# Patient Record
Sex: Male | Born: 1965 | Race: White | Hispanic: No | Marital: Single | Smoking: Never smoker
Health system: Southern US, Community
[De-identification: ages and names within clinical notes are randomized; demographics above are authoritative.]

## PROBLEM LIST (undated history)

## (undated) DIAGNOSIS — R079 Chest pain, unspecified: Secondary | ICD-10-CM

## (undated) HISTORY — PX: TONSILLECTOMY: SUR1361

## (undated) HISTORY — DX: Chest pain, unspecified: R07.9

---

## 2011-10-06 ENCOUNTER — Encounter (HOSPITAL_COMMUNITY): Payer: Self-pay | Admitting: Emergency Medicine

## 2011-10-06 ENCOUNTER — Emergency Department (HOSPITAL_COMMUNITY)
Admission: EM | Admit: 2011-10-06 | Discharge: 2011-10-06 | Disposition: A | Discharge status: Home / Self Care | Payer: Self-pay | Attending: Emergency Medicine | Admitting: Emergency Medicine

## 2011-10-06 ENCOUNTER — Emergency Department (HOSPITAL_COMMUNITY): Payer: Self-pay

## 2011-10-06 DIAGNOSIS — M79609 Pain in unspecified limb: Secondary | ICD-10-CM | POA: Insufficient documentation

## 2011-10-06 DIAGNOSIS — W230XXA Caught, crushed, jammed, or pinched between moving objects, initial encounter: Secondary | ICD-10-CM | POA: Insufficient documentation

## 2011-10-06 DIAGNOSIS — T148XXA Other injury of unspecified body region, initial encounter: Secondary | ICD-10-CM

## 2011-10-06 DIAGNOSIS — S6990XA Unspecified injury of unspecified wrist, hand and finger(s), initial encounter: Secondary | ICD-10-CM | POA: Insufficient documentation

## 2011-10-06 DIAGNOSIS — IMO0002 Reserved for concepts with insufficient information to code with codable children: Secondary | ICD-10-CM | POA: Insufficient documentation

## 2011-10-06 DIAGNOSIS — S60229A Contusion of unspecified hand, initial encounter: Secondary | ICD-10-CM | POA: Insufficient documentation

## 2011-10-06 MED ORDER — IBUPROFEN 800 MG PO TABS
800.0000 mg | ORAL_TABLET | Freq: Once | ORAL | Status: AC
  Administered 2011-10-06: 800 mg via ORAL
  Filled 2011-10-06: qty 1

## 2011-10-06 MED ORDER — IBUPROFEN 800 MG PO TABS: 800.0000 mg | ORAL_TABLET | Freq: Three times a day (TID) | ORAL | Status: AC
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
## 2011-10-06 NOTE — ED Notes (Signed)
Pt alert, nad, c/o left hand pain, onset this evening, states "shut in house door", PMS intact, no obvious deformity noted, +edema noted,

## 2011-10-06 NOTE — ED Provider Notes (Signed)
History     CSN: [id number]  Arrival date & time 10/06/11  0403   First MD Initiated Contact with Patient 10/06/11 [phone]      Chief Complaint  Patient presents with  . Hand Injury    Left    (Consider location/radiation/quality/duration/timing/severity/associated sxs/prior treatment) Patient is a 46 y.o. male presenting with hand injury. The history is provided by the patient.  Hand Injury  The incident occurred 1 to 2 hours ago. The incident occurred at home. Injury mechanism: Left hand was shut in a door. The pain is present in the left hand. The quality of the pain is described as aching. The pain is moderate. The pain has been constant since the incident. Pertinent negatives include no fever. Associated symptoms comments: No associated weakness or numbness. No difficulty making a fist or opening his hand and fingers.Marland Kitchen He reports no foreign bodies present. The symptoms are aggravated by movement and palpation. He has tried nothing for the symptoms.   At home was going to shut the light off and rhythm and his wife shut the door. He sustained abrasion and injury to his left hand mostly over the dorsal aspect of second and third  Carpal bones. No bleeding. No other injury. Pain sharp in quality and not radiating. Constant since onset. History reviewed. No pertinent past medical history.  Past Surgical History  Procedure Date  . Tonsillectomy     No family history on file.  History  Substance Use Topics  . Smoking status: Never Smoker   . Smokeless tobacco: Not on file  . Alcohol Use: No      Review of Systems  Constitutional: Negative for fever and chills.  HENT: Negative for neck pain and neck stiffness.   Eyes: Negative for pain.  Respiratory: Negative for shortness of breath.   Cardiovascular: Negative for chest pain.  Gastrointestinal: Negative for abdominal pain.  Genitourinary: Negative for dysuria.  Musculoskeletal: Negative for back pain.  Skin: Positive for  wound.  Neurological: Negative for headaches.  All other systems reviewed and are negative.    Allergies  Review of patient's allergies indicates no known allergies.  Home Medications  No current outpatient prescriptions on file.  BP 142/91  Pulse 93  Temp(Src) 98.1 F (36.7 C) (Oral)  Resp 20  Wt 180 lb (81.647 kg)  SpO2 95%  Physical Exam  Constitutional: He is oriented to person, place, and time. He appears well-developed and well-nourished.  HENT:  Head: Normocephalic and atraumatic.  Eyes: EOM are normal. Pupils are equal, round, and reactive to light.  Neck: Trachea normal. Neck supple.  Cardiovascular: Normal rate, regular rhythm, S1 normal, S2 normal and normal pulses.     No systolic murmur is present   No diastolic murmur is present  Pulses:      Radial pulses are 2+ on the right side, and 2+ on the left side.  Pulmonary/Chest: Effort normal and breath sounds normal. He has no wheezes. He has no rhonchi. He has no rales. He exhibits no tenderness.  Abdominal: Normal appearance. There is no CVA tenderness and negative Murphy's sign.  Musculoskeletal:       Left upper extremity: Tenderness to palpation with associated swelling over second and third metacarpals. No tenderness over anatomical snuff box. No ecchymosis. There is mild abrasion but no deep laceration. Distal neurovascular intact with full flexion and extension to all digits. Distal cap refill intact  Neurological: He is alert and oriented to person, place, and time. He has  normal strength. No cranial nerve deficit or sensory deficit. GCS eye subscore is 4. GCS verbal subscore is 5. GCS motor subscore is 6.  Skin: Skin is warm and dry. No rash noted. He is not diaphoretic.  Psychiatric: His speech is normal.       Cooperative and appropriate    ED Course  Procedures (including critical care time)  Labs Reviewed - No data to display Dg Hand Complete Left  10/06/2011  *RADIOLOGY REPORT*  Clinical Data:  Left hand pain status post trauma.  LEFT HAND - COMPLETE 3+ VIEW  Comparison: None.  Findings: No displaced fracture or dislocation identified.  No aggressive osseous lesions.  IMPRESSION: No acute osseous abnormality identified.  Original Report Authenticated By: Waneta Martins, M.D.   Gustavus Bryant. Ibuprofen. X-ray as above.   MDM   Left hand injury with no fractures identified an x-ray. Distal neurovascular is intact and stable for discharge home with plan treatment ice, elevation, rest and NSAIDs        Sunnie Nielsen, MD 10/06/11 [phone]

## 2015-04-12 DIAGNOSIS — Y9355 Activity, bike riding: Secondary | ICD-10-CM | POA: Insufficient documentation

## 2015-04-12 DIAGNOSIS — S199XXA Unspecified injury of neck, initial encounter: Secondary | ICD-10-CM | POA: Diagnosis present

## 2015-04-12 DIAGNOSIS — S161XXA Strain of muscle, fascia and tendon at neck level, initial encounter: Secondary | ICD-10-CM | POA: Insufficient documentation

## 2015-04-12 DIAGNOSIS — S299XXA Unspecified injury of thorax, initial encounter: Secondary | ICD-10-CM | POA: Insufficient documentation

## 2015-04-12 DIAGNOSIS — Z880 Allergy status to penicillin: Secondary | ICD-10-CM | POA: Insufficient documentation

## 2015-04-12 DIAGNOSIS — Y9289 Other specified places as the place of occurrence of the external cause: Secondary | ICD-10-CM | POA: Diagnosis not present

## 2015-04-12 DIAGNOSIS — Y998 Other external cause status: Secondary | ICD-10-CM | POA: Diagnosis not present

## 2015-04-13 ENCOUNTER — Emergency Department (HOSPITAL_COMMUNITY): Payer: No Typology Code available for payment source

## 2015-04-13 ENCOUNTER — Emergency Department (HOSPITAL_COMMUNITY)
Admission: EM | Admit: 2015-04-13 | Discharge: 2015-04-13 | Disposition: A | Discharge status: Home / Self Care | Payer: No Typology Code available for payment source | Attending: Emergency Medicine | Admitting: Emergency Medicine

## 2015-04-13 ENCOUNTER — Encounter (HOSPITAL_COMMUNITY): Payer: Self-pay | Admitting: Emergency Medicine

## 2015-04-13 DIAGNOSIS — S161XXA Strain of muscle, fascia and tendon at neck level, initial encounter: Secondary | ICD-10-CM

## 2015-04-13 MED ORDER — MELOXICAM 15 MG PO TABS: 15.0000 mg | ORAL_TABLET | Freq: Every day | ORAL | Status: DC

## 2015-04-13 MED ORDER — CYCLOBENZAPRINE HCL 10 MG PO TABS: 10.0000 mg | ORAL_TABLET | Freq: Two times a day (BID) | ORAL | Status: DC | PRN

## 2015-04-13 MED ORDER — NAPROXEN 500 MG PO TABS
500.0000 mg | ORAL_TABLET | Freq: Once | ORAL | Status: AC
  Administered 2015-04-13: 500 mg via ORAL
  Filled 2015-04-13: qty 1
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
## 2015-04-13 NOTE — Discharge Instructions (Signed)
Take mobic as needed for pain. Take flexeril as needed for muscle spasm. Refer to attached documents for more information. Return to the ED with worsening or concerning symptoms.  °

## 2018-12-23 ENCOUNTER — Ambulatory Visit
Admission: EM | Admit: 2018-12-23 | Discharge: 2018-12-23 | Disposition: A | Discharge status: Home / Self Care | Payer: Self-pay | Attending: Family Medicine | Admitting: Family Medicine

## 2018-12-23 ENCOUNTER — Encounter: Payer: Self-pay | Admitting: Emergency Medicine

## 2018-12-23 ENCOUNTER — Other Ambulatory Visit: Payer: Self-pay

## 2018-12-23 ENCOUNTER — Ambulatory Visit (INDEPENDENT_AMBULATORY_CARE_PROVIDER_SITE_OTHER): Payer: Self-pay

## 2018-12-23 DIAGNOSIS — R05 Cough: Secondary | ICD-10-CM

## 2018-12-23 DIAGNOSIS — R059 Cough, unspecified: Secondary | ICD-10-CM

## 2018-12-23 NOTE — ED Provider Notes (Signed)
[facility]    CSN: [id number] Arrival date & time: 12/23/18  1232     History   Chief Complaint Chief Complaint  Patient presents with  . Cough  . Dizziness    HPI Ernest Salazar is a 53 y.o. male.   Pt is a 52 year old male with no significant PMH.  Presents today with body aches, fatigue.  He has been sick with cough and viral type illness for the past month.  The sickness initially started off with a fever that lasted approximately 1 week and pretty consistent cough.  This has since resolved.  He does have a lingering cough.  His main complaints today are that he went to work and felt very fatigued climbing up the ladder and dizzy.  He is a Education administrator. Reports that he has been staying hydrated drinking plenty of fluids and eating healthy diet. He has been off work x 1 month.  He has had persistent headache and been taking Aleve twice a day for this which somewhat helps.  Unknown of any known sick contacts or COVID exposure.  No recent traveling.  No chest pain, shortness of breath, palpitations, lower extremity swelling.  No history of DVT or PE. He does not smoke.   ROS per HPI      History reviewed. No pertinent past medical history.  There are no active problems to display for this patient.   Past Surgical History:  Procedure Laterality Date  . TONSILLECTOMY         Home Medications    Prior to Admission medications   Medication Sig Start Date End Date Taking? Authorizing Provider  cyclobenzaprine (FLEXERIL) 10 MG tablet Take 1 tablet (10 mg total) by mouth 2 (two) times daily as needed for muscle spasms. 04/13/15   Szekalski, Kaitlyn, PA-C  meloxicam (MOBIC) 15 MG tablet Take 1 tablet (15 mg total) by mouth daily. 04/13/15   Emilia Beck, PA-C    Family History History reviewed. No pertinent family history.  Social History Social History   Tobacco Use  . Smoking status: Never Smoker  . Smokeless tobacco: Never Used  Substance Use Topics   . Alcohol use: No  . Drug use: Not on file     Allergies   Penicillins   Review of Systems Review of Systems   Physical Exam Triage Vital Signs ED Triage Vitals [12/23/18 1243]  Enc Vitals Group     BP (!) 132/93     Pulse Rate 85     Resp 20     Temp 97.9 F (36.6 C)     Temp Source Oral     SpO2 95 %     Weight      Height      Head Circumference      Peak Flow      Pain Score 8     Pain Loc      Pain Edu?      Excl. in GC?    No data found.  Updated Vital Signs BP (!) 132/93 (BP Location: Left Arm)   Pulse 85   Temp 97.9 F (36.6 C) (Oral)   Resp 20   SpO2 95%   Visual Acuity Right Eye Distance:   Left Eye Distance:   Bilateral Distance:    Right Eye Near:   Left Eye Near:    Bilateral Near:     Physical Exam Vitals signs and nursing note reviewed.  Constitutional:      General:  He is not in acute distress.    Appearance: Normal appearance. He is not ill-appearing, toxic-appearing or diaphoretic.  HENT:     Head: Normocephalic and atraumatic.     Right Ear: Tympanic membrane and ear canal normal.     Left Ear: Tympanic membrane and ear canal normal.     Nose: Nose normal.     Mouth/Throat:     Pharynx: Oropharynx is clear.  Eyes:     Conjunctiva/sclera: Conjunctivae normal.  Neck:     Musculoskeletal: Normal range of motion.  Cardiovascular:     Rate and Rhythm: Normal rate and regular rhythm.  Pulmonary:     Effort: Pulmonary effort is normal.     Breath sounds: Normal breath sounds.  Musculoskeletal: Normal range of motion.  Skin:    General: Skin is warm and dry.  Neurological:     Mental Status: He is alert.  Psychiatric:        Mood and Affect: Mood normal.      UC Treatments / Results  Labs (all labs ordered are listed, but only abnormal results are displayed) Labs Reviewed - No data to display  EKG None  Radiology Dg Chest 2 View  Result Date: 12/23/2018 CLINICAL DATA:  Flu like symptoms for 6 weeks.  Cough.  EXAM: CHEST - 2 VIEW COMPARISON:  None. FINDINGS: The heart size and mediastinal contours are within normal limits. Both lungs are clear. The visualized skeletal structures are unremarkable. IMPRESSION: No active cardiopulmonary disease. Electronically Signed   By: Gerome Sam III M.D   On: 12/23/2018 13:36    Procedures Procedures (including critical care time)  Medications Ordered in UC Medications - No data to display  Initial Impression / Assessment and Plan / UC Course  I have reviewed the triage vital signs and the nursing notes.  Pertinent labs & imaging results that were available during my care of the patient were reviewed by me and considered in my medical decision making (see chart for details).     Cough-   Pt is a healthy 53 year old male the presents today with persistent lingering body aches, fatigue Most likely these are lingering effects of some sort of viral illness, most likely COVID exposure. Chest x-ray was normal Vital signs are stable and he is nontoxic or ill-appearing. We will have him take the rest of the week off and rest Instructed to stay hydrated and eat foods rich in potassium for the muscle cramping Follow up as needed for continued or worsening symptoms  Final Clinical Impressions(s) / UC Diagnoses   Final diagnoses:  Cough     Discharge Instructions     Your chest x-ray was normal This is most likely lingering effects from a viral illness most likely from COVID I will give you a note for the rest of the week off work to rest Make sure you are staying hydrated and drinking plenty of water Try to intake sources of potassium such as  ? Nuts, such as peanuts and pistachios. ? Seeds, such as sunflower seeds and pumpkin seeds. ? Peas, lentils, and lima beans. ? Whole grain and bran cereals and breads. ? Fresh fruits and vegetables, such as apricots, avocado, bananas, cantaloupe, kiwi, oranges, tomatoes, asparagus, and potatoes. ? Orange  juice. ? Tomato juice. ? Red meats. ? Yogurt. Follow up as needed for continued or worsening symptoms     ED Prescriptions    None     Controlled Substance Prescriptions  Controlled Substance Registry consulted?  Not Applicable   Janace ArisBast, Yazleemar Strassner A, NP 12/23/18 1430

## 2018-12-23 NOTE — ED Notes (Signed)
Patient able to ambulate independently  

## 2018-12-23 NOTE — ED Triage Notes (Signed)
Pt presents to [facility] for assessment after being out with flu-like symptoms for six weeks.  Pt states he has been feeling better, cough had resolved, so he decided to go back to work.  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

## 2018-12-23 NOTE — Discharge Instructions (Addendum)
Your chest x-ray was normal This is most likely lingering effects from a viral illness most likely from COVID I will give you a note for the rest of the week off work to rest Make sure you are staying hydrated and drinking plenty of water Try to intake sources of potassium such as  Nuts, such as peanuts and pistachios. Seeds, such as sunflower seeds and pumpkin seeds. Peas, lentils, and lima beans. Whole grain and bran cereals and breads. Fresh fruits and vegetables, such as apricots, avocado, bananas, cantaloupe, kiwi, oranges, tomatoes, asparagus, and potatoes. Orange juice. Tomato juice. Red meats. Yogurt. Follow up as needed for continued or worsening symptoms

## 2019-01-13 ENCOUNTER — Ambulatory Visit (INDEPENDENT_AMBULATORY_CARE_PROVIDER_SITE_OTHER): Payer: Self-pay

## 2019-01-13 ENCOUNTER — Ambulatory Visit
Admission: EM | Admit: 2019-01-13 | Discharge: 2019-01-13 | Disposition: A | Discharge status: Home / Self Care | Payer: Self-pay | Attending: Physician Assistant | Admitting: Physician Assistant

## 2019-01-13 ENCOUNTER — Telehealth: Payer: Self-pay

## 2019-01-13 DIAGNOSIS — R059 Cough, unspecified: Secondary | ICD-10-CM

## 2019-01-13 DIAGNOSIS — R05 Cough: Secondary | ICD-10-CM

## 2019-01-13 MED ORDER — ALBUTEROL SULFATE HFA 108 (90 BASE) MCG/ACT IN AERS: 1.0000 | INHALATION_SPRAY | Freq: Four times a day (QID) | RESPIRATORY_TRACT | 0 refills | Status: DC | PRN

## 2019-01-13 MED ORDER — ALBUTEROL SULFATE HFA 108 (90 BASE) MCG/ACT IN AERS: 1.0000 | INHALATION_SPRAY | Freq: Four times a day (QID) | RESPIRATORY_TRACT | 0 refills | Status: AC | PRN

## 2019-01-13 NOTE — ED Triage Notes (Signed)
Pt c/o dry cough, fatigue, SOB on exertion, neck pain, and headache for a month and a half

## 2019-01-13 NOTE — Discharge Instructions (Signed)
Schedule appointment for primary care evaluation.   Your chest xray if normal

## 2019-01-14 NOTE — ED Provider Notes (Signed)
[facility]    CSN: [id number] Arrival date & time: 01/13/19  1503     History   Chief Complaint Chief Complaint  Patient presents with  . Cough    HPI Sherre Lainommy Napier is a 53 y.o. male.   The history is provided by the patient. No language interpreter was used.  Cough  Cough characteristics:  Non-productive Sputum characteristics:  Nondescript Severity:  Moderate Onset quality:  Gradual Duration:  5 weeks Timing:  Constant Progression:  Unchanged Chronicity:  New Smoker: no   Relieved by:  Nothing Worsened by:  Nothing Ineffective treatments:  None tried Associated symptoms: myalgias   Associated symptoms: no chest pain   Pt thinks he may have had covid 19.  Pt was seen 3 weeks ago for a cough.  He had a normal chest xray.  Pt reports he still feels fatigued and still has a cough   History reviewed. No pertinent past medical history.  There are no active problems to display for this patient.   Past Surgical History:  Procedure Laterality Date  . TONSILLECTOMY         Home Medications    Prior to Admission medications   Medication Sig Start Date End Date Taking? Authorizing Provider  albuterol (VENTOLIN HFA) 108 (90 Base) MCG/ACT inhaler Inhale 1-2 puffs into the lungs every 6 (six) hours as needed for wheezing or shortness of breath. 01/13/19   Elson AreasSofia, Latresa Gasser K, PA-C    Family History No family history on file.  Social History Social History   Tobacco Use  . Smoking status: Never Smoker  . Smokeless tobacco: Never Used  Substance Use Topics  . Alcohol use: No  . Drug use: Not on file     Allergies   Penicillins   Review of Systems Review of Systems  Respiratory: Positive for cough.   Cardiovascular: Negative for chest pain.  Musculoskeletal: Positive for myalgias.  All other systems reviewed and are negative.    Physical Exam Triage Vital Signs ED Triage Vitals  Enc Vitals Group     BP 01/13/19 1548 132/85     Pulse  Rate 01/13/19 1548 77     Resp 01/13/19 1548 18     Temp 01/13/19 1548 98 F (36.7 C)     Temp Source 01/13/19 1548 Oral     SpO2 01/13/19 1548 97 %     Weight --      Height --      Head Circumference --      Peak Flow --      Pain Score 01/13/19 1549 6     Pain Loc --      Pain Edu? --      Excl. in GC? --    No data found.  Updated Vital Signs BP 132/85 (BP Location: Left Arm)   Pulse 77   Temp 98 F (36.7 C) (Oral)   Resp 18   SpO2 97%   Visual Acuity Right Eye Distance:   Left Eye Distance:   Bilateral Distance:    Right Eye Near:   Left Eye Near:    Bilateral Near:     Physical Exam Vitals signs and nursing note reviewed.  Constitutional:      Appearance: He is well-developed.  HENT:     Head: Normocephalic and atraumatic.     Nose: Nose normal.     Mouth/Throat:     Mouth: Mucous membranes are moist.  Eyes:     Conjunctiva/sclera: Conjunctivae  normal.  Neck:     Musculoskeletal: Normal range of motion and neck supple.  Cardiovascular:     Rate and Rhythm: Normal rate and regular rhythm.     Heart sounds: No murmur.  Pulmonary:     Effort: Pulmonary effort is normal. No respiratory distress.     Breath sounds: Normal breath sounds.  Abdominal:     Palpations: Abdomen is soft.     Tenderness: There is no abdominal tenderness.  Skin:    General: Skin is warm and dry.  Neurological:     General: No focal deficit present.     Mental Status: He is alert.  Psychiatric:        Mood and Affect: Mood normal.      UC Treatments / Results  Labs (all labs ordered are listed, but only abnormal results are displayed) Labs Reviewed - No data to display  EKG None  Radiology Dg Chest 2 View  Result Date: 01/13/2019 CLINICAL DATA:  Shortness of breath and cough EXAM: CHEST - 2 VIEW COMPARISON:  December 23, 2018 FINDINGS: No edema or consolidation. Heart size and pulmonary vascularity are normal. No adenopathy. There is slight degenerative change in the  thoracic spine. IMPRESSION: No edema or consolidation. Electronically Signed   By: Bretta Bang III M.D.   On: 01/13/2019 16:18    Procedures Procedures (including critical care time)  Medications Ordered in UC Medications - No data to display  Initial Impression / Assessment and Plan / UC Course  I have reviewed the triage vital signs and the nursing notes.  Pertinent labs & imaging results that were available during my care of the patient were reviewed by me and considered in my medical decision making (see chart for details).     MDM  Pt has a normal chest xray.  I will try pt on an albuterol inhaler.  I think pt would benefit from a complete evaluation with a primary care MD.  Pt does not have a regular MD.  Pt given the number For Jerrilyn Cairo to establish care.  Final Clinical Impressions(s) / UC Diagnoses   Final diagnoses:  Cough     Discharge Instructions     Schedule appointment for primary care evaluation.   Your chest xray if normal    ED Prescriptions    Medication Sig Dispense Auth. Provider   albuterol (VENTOLIN HFA) 108 (90 Base) MCG/ACT inhaler  (Status: Discontinued) Inhale 1-2 puffs into the lungs every 6 (six) hours as needed for wheezing or shortness of breath. 1 Inhaler Ashwath Lasch K, PA-C   albuterol (VENTOLIN HFA) 108 (90 Base) MCG/ACT inhaler Inhale 1-2 puffs into the lungs every 6 (six) hours as needed for wheezing or shortness of breath. 1 Inhaler Elson Areas, [place]     Controlled Substance Prescriptions [place] Controlled Substance Registry consulted? Not Applicable  An After Visit Summary was printed and given to the patient.    Elson Areas, [place] 01/14/19 1846

## 2019-01-30 ENCOUNTER — Ambulatory Visit: Payer: Self-pay | Admitting: Family Medicine

## 2021-03-08 ENCOUNTER — Encounter: Payer: Self-pay | Admitting: Cardiology

## 2021-03-08 ENCOUNTER — Ambulatory Visit (INDEPENDENT_AMBULATORY_CARE_PROVIDER_SITE_OTHER): Payer: Self-pay | Admitting: Cardiology

## 2021-03-08 ENCOUNTER — Other Ambulatory Visit: Payer: Self-pay

## 2021-03-08 VITALS — BP 120/90 | HR 67 | Ht 70.0 in | Wt 200.0 lb

## 2021-03-08 DIAGNOSIS — R072 Precordial pain: Secondary | ICD-10-CM

## 2021-03-08 DIAGNOSIS — Z8249 Family history of ischemic heart disease and other diseases of the circulatory system: Secondary | ICD-10-CM

## 2021-03-08 MED ORDER — METOPROLOL TARTRATE 50 MG PO TABS: 50.0000 mg | ORAL_TABLET | Freq: Once | ORAL | 0 refills | Status: AC

## 2021-03-08 NOTE — Patient Instructions (Signed)
Medication Instructions:  The current medical regimen is effective;  continue present plan and medications.  *If you need a refill on your cardiac medications before your next appointment, please call your pharmacy*   Lab Work: none If you have labs (blood work) drawn today and your tests are completely normal, you will receive your results only by: MyChart Message (if you have MyChart) OR A paper copy in the mail If you have any lab test that is abnormal or we need to change your treatment, we will call you to review the results.   Testing/Procedures: Your physician has requested that you have Coronary CT. Cardiac computed tomography (CT) is a painless test that uses an x-ray machine to take clear, detailed pictures of your heart. For further information please visit [email or web address] Please follow instruction sheet as given.  Follow-Up: At [facility], you and your health needs are our priority.  As part of our continuing mission to provide you with exceptional heart care, we have created designated Provider Care Teams.  These Care Teams include your primary Cardiologist (physician) and Advanced Practice Providers (APPs -  Physician Assistants and Nurse Practitioners) who all work together to provide you with the care you need, when you need it.  We recommend signing up for the patient portal called "MyChart".  Sign up information is provided on this After Visit Summary.  MyChart is used to connect with patients for Virtual Visits (Telemedicine).  Patients are able to view lab/test results, encounter notes, upcoming appointments, etc.  Non-urgent messages can be sent to your provider as well.   To learn more about what you can do with MyChart, go to [email or web address].    Follow up will be based on the results of the above testing.   Thank you for choosing [facility]!!     Your cardiac CT will be scheduled at:   [facility] [address] [place], [place] [address] [phone]  Please arrive at the [facility] main entrance (entrance A) of [facility] 30 minutes prior to test start time. Proceed to the [facility] Radiology Department (first floor) to check-in and test prep.  Please follow these instructions carefully (unless otherwise directed):  Hold all erectile dysfunction medications at least 3 days (72 hrs) prior to test.  On the Night Before the Test: Be sure to Drink plenty of water. Do not consume any caffeinated/decaffeinated beverages or chocolate 12 hours prior to your test. Do not take any antihistamines 12 hours prior to your test. If the patient has contrast allergy: Patient will need a prescription for Prednisone and very clear instructions (as follows): Prednisone 50 mg - take 13 hours prior to test Take another Prednisone 50 mg 7 hours prior to test Take another Prednisone 50 mg 1 hour prior to test Take Benadryl 50 mg 1 hour prior to test Patient must complete all four doses of above prophylactic medications. Patient will need a ride after test due to Benadryl.  On the Day of the Test: Drink plenty of water until 1 hour prior to the test. Do not eat any food 4 hours prior to the test. You may take your regular medications prior to the test.  Take metoprolol (Lopressor) two hours prior to test. HOLD Furosemide/Hydrochlorothiazide morning of the test.  After the Test: Drink plenty of water. After receiving IV contrast, you may experience a mild flushed feeling. This is normal. On occasion, you may experience a mild rash up to 24 hours  after the test. This is not dangerous. If this occurs, you can take Benadryl 25 mg and increase your fluid intake. If you experience trouble breathing, this can be serious. If it is severe call 911 IMMEDIATELY. If it is mild, please call our office.  Once we have confirmed authorization from your insurance company, we will call you to set up a date and  time for your test. Based on how quickly your insurance processes prior authorizations requests, please allow up to 4 weeks to be contacted for scheduling your Cardiac CT appointment. Be advised that routine Cardiac CT appointments could be scheduled as many as 8 weeks after your provider has ordered it.  For non-scheduling related questions, please contact the cardiac imaging nurse navigator should you have any questions/concerns: Rockwell Alexandria, Cardiac Imaging Nurse Navigator Larey Brick, Cardiac Imaging Nurse Navigator [facility] Direct Office Dial: [phone]   For scheduling needs, including cancellations and rescheduling, please call [place], [phone].

## 2021-03-08 NOTE — Progress Notes (Signed)
Cardiology Office Note:    Date:  03/08/2021   ID:  Ernest Salazar, DOB 1966-05-23, MRN [id number]  PCP:  Lonie Peak, PA-C   [facility] Providers Cardiologist:  None     Referring MD: Lonie Peak, PA-C     History of Present Illness:    Ernest Salazar is a 55 y.o. male here for the evaluation of chest pain at the request of Lonie Peak, [place].  In review of outside records, chest pain for started approximately 3 months ago on the left side anterior and axillary lines.  Felt more like a sharp stabbing pain that occurred with stressful situations.  Sometimes he would feel it with activity as well.  Feels like a pressure tightness.  Sometimes may feel some shortness of breath with this.  On the other hand sometimes there is just a stabbing pain in the left side.  A few weeks ago he had a fall from a ladder approximately 20 feet high developed some right shoulder pain.  No fevers chills nausea vomiting syncope.  Sometimes like taking hand pushing in axilla, all the time 3 months. Left hand goes numb. Over weekend full hand went numb.   More fatigue tired. Heavy.   His father died after a murder in 2007/01/11.  5 uncles died of CAD, MI. He is non-smoker.  He is a Education administrator.  Past Medical History:  Diagnosis Date   Chest pain     Past Surgical History:  Procedure Laterality Date   TONSILLECTOMY      Current Medications: Current Meds  Medication Sig   metoprolol tartrate (LOPRESSOR) 50 MG tablet Take 1 tablet (50 mg total) by mouth once for 1 dose. Take (1) tab 2 hours before your CT scan     Allergies:   Doxycycline hyclate and Penicillins   Social History   Socioeconomic History   Marital status: Single    Spouse name: Not on file   Number of children: Not on file   Years of education: Not on file   Highest education level: Not on file  Occupational History   Not on file  Tobacco Use   Smoking status: Never   Smokeless tobacco: Never  Substance and Sexual Activity    Alcohol use: No   Drug use: Not on file   Sexual activity: Not on file  Other Topics Concern   Not on file  Social History Narrative   Not on file   Social Determinants of Health   Financial Resource Strain: Not on file  Food Insecurity: Not on file  Transportation Needs: Not on file  Physical Activity: Not on file  Stress: Not on file  Social Connections: Not on file     Family History: The patient's family history includes Arthritis in his mother; Cancer in his brother and mother; Diabetes in his mother; Other in his father and mother.  ROS:   Please see the history of present illness.     All other systems reviewed and are negative.  EKGs/Labs/Other Studies Reviewed:    The following studies were reviewed today: Prior office records reviewed.  EKG:  EKG is  ordered today.  The ekg ordered today demonstrates sinus rhythm 67 with left axis deviation.  Prior EKG from outside office personally reviewed shows sinus rhythm with heart rate 70 bpm with no acute ischemic changes.    Recent Labs: No results found for requested labs within last 8760 hours.  Recent Lipid Panel No results found for: CHOL, TRIG, HDL,  CHOLHDL, VLDL, LDLCALC, LDLDIRECT   Risk Assessment/Calculations:          Physical Exam:    VS:  BP 120/90 (BP Location: Left Arm, Patient Position: Sitting, Cuff Size: Normal)   Pulse 67   Ht 5\' 10"  (1.778 m)   Wt 200 lb (90.7 kg)   SpO2 96%   BMI 28.70 kg/m     Wt Readings from Last 3 Encounters:  03/08/21 200 lb (90.7 kg)  10/06/11 180 lb (81.6 kg)     GEN:  Well nourished, well developed in no acute distress HEENT: Normal NECK: No JVD; No carotid bruits LYMPHATICS: No lymphadenopathy CARDIAC: RRR, no murmurs, rubs, gallops RESPIRATORY:  Clear to auscultation without rales, wheezing or rhonchi  ABDOMEN: Soft, non-tender, non-distended MUSCULOSKELETAL:  No edema; No deformity  SKIN: Warm and dry NEUROLOGIC:  Alert and oriented x 3 PSYCHIATRIC:   Normal affect   ASSESSMENT:    1. Precordial pain   2. Family history of early CAD    PLAN:    In order of problems listed above:  Chest pain - Has both typical as well as atypical features.  I think a coronary CT scan with possible FFR would be helpful given his strong family history with uncles having CAD.  We will go ahead and get him set up for this. -If CT scan is unremarkable, many of his symptoms are compatible with neuropathic pain perhaps cervical spine disease given the numbness in his fingers or musculoskeletal disease given some of the continuous tight discomfort in his axillary region.  He may benefit from neurologic evaluation/orthopedic evaluation.  Family history of CAD - Checking coronary CT scan.  We are going to give him metoprolol 50 mg premedication. -Recommend checking lipids.    Medication Adjustments/Labs and Tests Ordered: Current medicines are reviewed at length with the patient today.  Concerns regarding medicines are outlined above.  Orders Placed This Encounter  Procedures   CT CORONARY MORPH W/CTA COR W/SCORE W/CA W/CM &/OR WO/CM   EKG 12-Lead   Meds ordered this encounter  Medications   metoprolol tartrate (LOPRESSOR) 50 MG tablet    Sig: Take 1 tablet (50 mg total) by mouth once for 1 dose. Take (1) tab 2 hours before your CT scan    Dispense:  1 tablet    Refill:  0    Patient Instructions  Medication Instructions:  The current medical regimen is effective;  continue present plan and medications.  *If you need a refill on your cardiac medications before your next appointment, please call your pharmacy*   Lab Work: none If you have labs (blood work) drawn today and your tests are completely normal, you will receive your results only by: MyChart Message (if you have MyChart) OR A paper copy in the mail If you have any lab test that is abnormal or we need to change your treatment, we will call you to review the  results.   Testing/Procedures: Your physician has requested that you have Coronary CT. Cardiac computed tomography (CT) is a painless test that uses an x-ray machine to take clear, detailed pictures of your heart. For further information please visit 12/04/11. Please follow instruction sheet as given.  Follow-Up: At [facility], you and your health needs are our priority.  As part of our continuing mission to provide you with exceptional heart care, we have created designated Provider Care Teams.  These Care Teams include your primary Cardiologist (physician) and Advanced Practice Providers (APPs -  Physician Assistants and Nurse Practitioners) who all work together to provide you with the care you need, when you need it.  We recommend signing up for the patient portal called "MyChart".  Sign up information is provided on this After Visit Summary.  MyChart is used to connect with patients for Virtual Visits (Telemedicine).  Patients are able to view lab/test results, encounter notes, upcoming appointments, etc.  Non-urgent messages can be sent to your provider as well.   To learn more about what you can do with MyChart, go to [email or web address].    Follow up will be based on the results of the above testing.   Thank you for choosing [facility]!!     Your cardiac CT will be scheduled at:   [facility] [address] [place], [place] [address] [phone]  Please arrive at the [facility] main entrance (entrance A) of [facility] 30 minutes prior to test start time. Proceed to the [facility] Radiology Department (first floor) to check-in and test prep.  Please follow these instructions carefully (unless otherwise directed):  Hold all erectile dysfunction medications at least 3 days (72 hrs) prior to test.  On the Night Before the Test: Be sure to Drink plenty of water. Do not consume any caffeinated/decaffeinated beverages or  chocolate 12 hours prior to your test. Do not take any antihistamines 12 hours prior to your test. If the patient has contrast allergy: Patient will need a prescription for Prednisone and very clear instructions (as follows): Prednisone 50 mg - take 13 hours prior to test Take another Prednisone 50 mg 7 hours prior to test Take another Prednisone 50 mg 1 hour prior to test Take Benadryl 50 mg 1 hour prior to test Patient must complete all four doses of above prophylactic medications. Patient will need a ride after test due to Benadryl.  On the Day of the Test: Drink plenty of water until 1 hour prior to the test. Do not eat any food 4 hours prior to the test. You may take your regular medications prior to the test.  Take metoprolol (Lopressor) two hours prior to test. HOLD Furosemide/Hydrochlorothiazide morning of the test.  After the Test: Drink plenty of water. After receiving IV contrast, you may experience a mild flushed feeling. This is normal. On occasion, you may experience a mild rash up to 24 hours after the test. This is not dangerous. If this occurs, you can take Benadryl 25 mg and increase your fluid intake. If you experience trouble breathing, this can be serious. If it is severe call 911 IMMEDIATELY. If it is mild, please call our office.  Once we have confirmed authorization from your insurance company, we will call you to set up a date and time for your test. Based on how quickly your insurance processes prior authorizations requests, please allow up to 4 weeks to be contacted for scheduling your Cardiac CT appointment. Be advised that routine Cardiac CT appointments could be scheduled as many as 8 weeks after your provider has ordered it.  For non-scheduling related questions, please contact the cardiac imaging nurse navigator should you have any questions/concerns: Rockwell Alexandria, Cardiac Imaging Nurse Navigator Larey Brick, Cardiac Imaging Nurse Navigator [facility] Direct Office Dial: [phone]   For scheduling needs, including cancellations and rescheduling, please call [place], [phone].   Signed, Donato Schultz, MD  03/08/2021 3:49 PM    [facility]

## 2021-03-16 ENCOUNTER — Telehealth (HOSPITAL_COMMUNITY): Payer: Self-pay | Admitting: Emergency Medicine

## 2021-03-16 NOTE — Telephone Encounter (Signed)
Reaching out to patient to offer assistance regarding upcoming cardiac imaging study; pt verbalizes understanding of appt date/time, parking situation and where to check in, pre-test NPO status and medications ordered, and verified current allergies; name and call back number provided for further questions should they arise Rockwell Alexandria RN Navigator Cardiac Imaging [facility] [phone] office [phone] cell  50mg  metoprolol tartrate  Denies iv issues Some claustro 

## 2021-03-17 ENCOUNTER — Ambulatory Visit (HOSPITAL_COMMUNITY)
Admission: RE | Admit: 2021-03-17 | Discharge: 2021-03-17 | Disposition: A | Discharge status: Home / Self Care | Payer: Self-pay | Source: Ambulatory Visit | Attending: Cardiology | Admitting: Cardiology

## 2021-03-17 ENCOUNTER — Other Ambulatory Visit: Payer: Self-pay

## 2021-03-17 DIAGNOSIS — R072 Precordial pain: Secondary | ICD-10-CM

## 2021-03-17 MED ORDER — NITROGLYCERIN 0.4 MG SL SUBL
0.8000 mg | SUBLINGUAL_TABLET | Freq: Once | SUBLINGUAL | Status: AC
  Administered 2021-03-17: 0.8 mg via SUBLINGUAL

## 2021-03-17 MED ORDER — NITROGLYCERIN 0.4 MG SL SUBL
SUBLINGUAL_TABLET | SUBLINGUAL | Status: AC
  Filled 2021-03-17: qty 2

## 2021-03-17 MED ORDER — IOHEXOL 350 MG/ML SOLN
100.0000 mL | Freq: Once | INTRAVENOUS | Status: AC | PRN
  Administered 2021-03-17: 100 mL via INTRAVENOUS

## 2022-01-07 IMAGING — CT CT HEART MORP W/ CTA COR W/ SCORE W/ CA W/CM &/OR W/O CM
4 of 7 series · 8 of 20 positions shown, 9 images · IV contrast (APPLIED)
Comparison: None.
COMPARISON: None.

Addendum:
EXAM:
OVER-READ INTERPRETATION  CT CHEST

The following report is an over-read performed by radiologist Dr.
Qwaqu Yorm [REDACTED] on 03/17/2021. This
over-read does not include interpretation of cardiac or coronary
anatomy or pathology. The coronary calcium score/coronary CTA
interpretation by the cardiologist is attached.
HISTORY: Chest pain/anginal equiv, ECGs or troponins abnormal chest pain
Cardiac/Coronary CT
TECHNIQUE: The patient was scanned on a Siemens Force scanner.
PROTOCOL: A 100 kV prospective scan was triggered in the descending thoracic
aorta at 111 HU's. Axial non-contrast 3 mm slices were carried out
through the heart. The data set was analyzed on a dedicated work
station and scored using the Agatson method. Gantry rotation speed
was 250 msecs and collimation was 0.6 mm. Heart rate optimized
medically, and 0.8 mg of sublingual nitroglycerin was given. The 3D
data set was reconstructed in 5% intervals of 35-75% of the R-R
cycle. Diastolic phases were analyzed on a dedicated work station
using MPR, MIP and VRT modes. The patient received 100mL OMNIPAQUE
IOHEXOL 350 MG/ML SOLN of contrast.

[Series 6: best diast 74 % · axial · 0.39mm/px · z∈[-343,-302]mm · 2 of 306 slices shown]
[im 102/306  vessel]
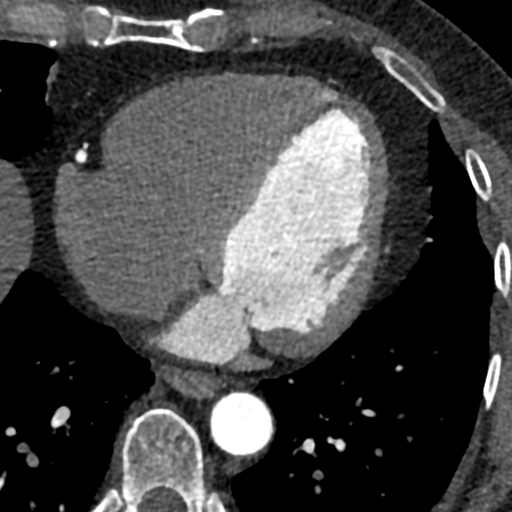
[im 204/306  vessel]
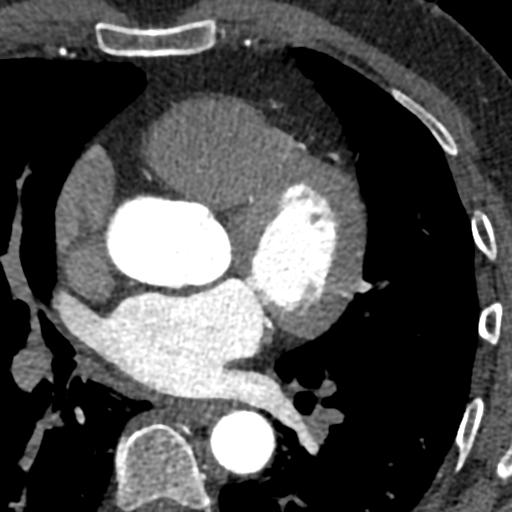

[Series 7: best syst · axial · 0.39mm/px · z∈[-343,-302]mm · 2 of 306 slices shown, 3 images]
[im 102/306  vessel]
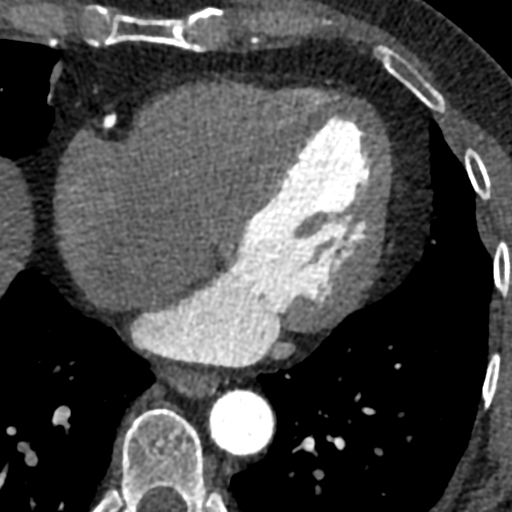
[im 102/306  lung]
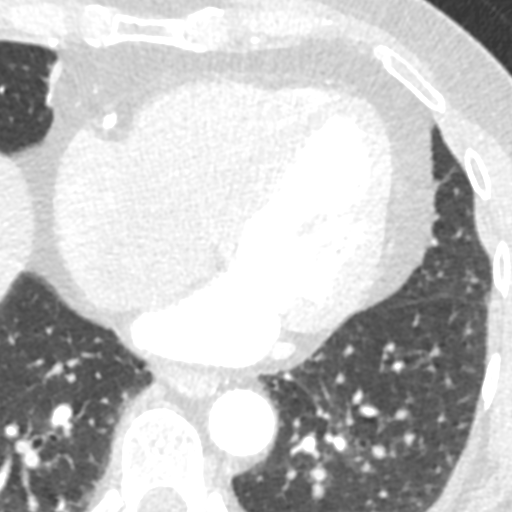
[im 204/306  vessel]
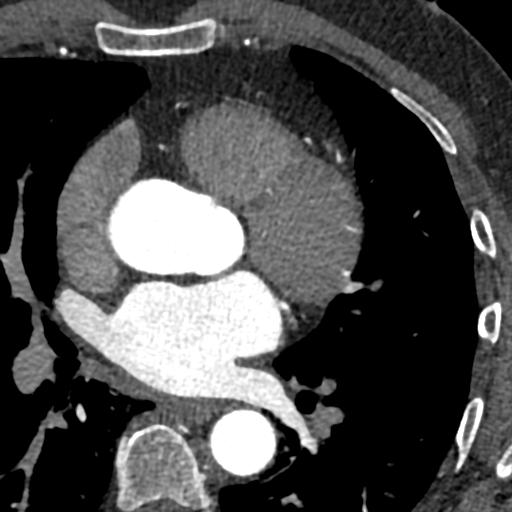

[Series 8: ts diast sharp 74 % · axial · 0.39mm/px · z∈[-343,-302]mm · 2 of 306 slices shown]
[im 102/306  lung]
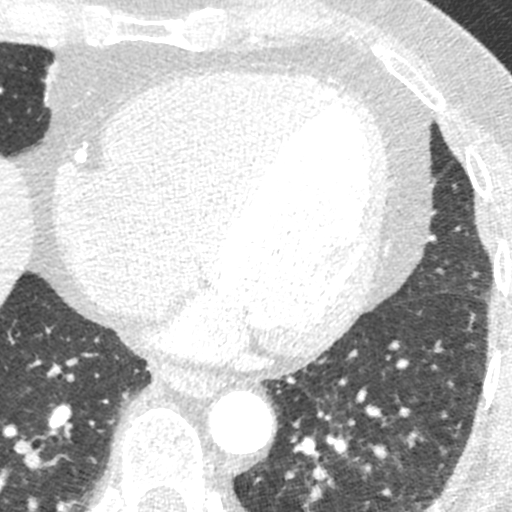
[im 204/306  lung]
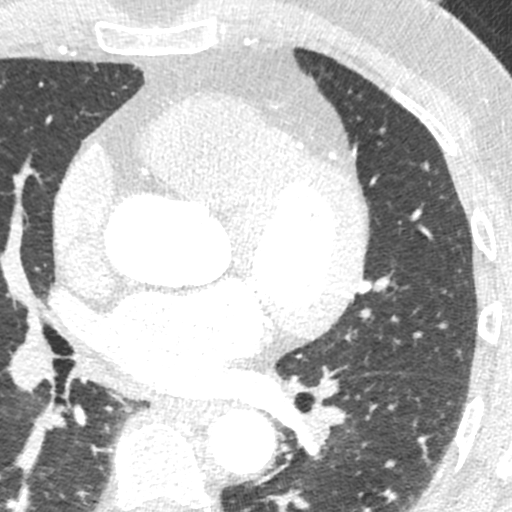

[Series 9: ts syst sharp 38 % · axial · 0.39mm/px · z∈[-343,-302]mm · 2 of 306 slices shown]
[im 102/306  lung]
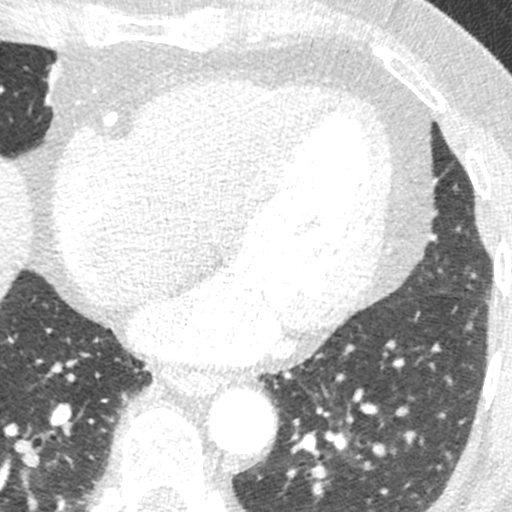
[im 204/306  lung]
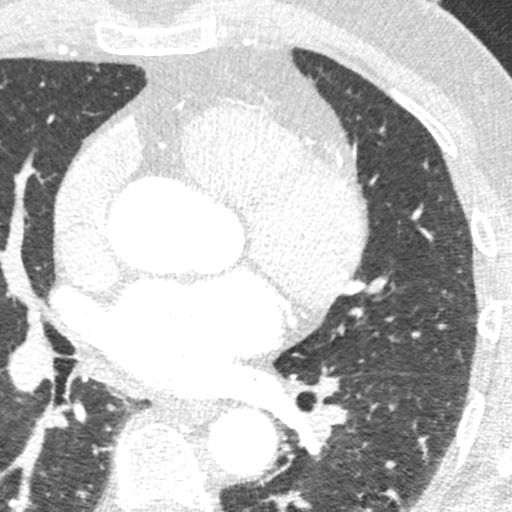

[8 of 20 positions shown; findings below may reference images not displayed]

FINDINGS: Within the visualized portions of the thorax there are no suspicious
appearing pulmonary nodules or masses, there is no acute
consolidative airspace disease, no pleural effusions, no
pneumothorax and no lymphadenopathy. Visualized portions of the
upper abdomen are unremarkable. There are no aggressive appearing
lytic or blastic lesions noted in the visualized portions of the
skeleton.
IMPRESSION: 1. No significant incidental noncardiac findings are noted
FINDINGS: Coronary calcium score: The patient's coronary artery calcium score
is 0, which places the patient in the 0 percentile.

Coronary arteries: Normal coronary origins.  Right dominance.

Right Coronary Artery: Normal caliber vessel, gives rise to PDA that
extends to apex. No significant plaque or stenosis.

Left Main Coronary Artery: Normal caliber vessel. No significant
plaque or stenosis.

Left Anterior Descending Coronary Artery: Normal caliber vessel. No
significant plaque or stenosis. Gives rise to large first diagonal
branch.

Left Circumflex Artery: Normal caliber vessel. No significant plaque
or stenosis. Gives rise to large OM 1 branch.

Aorta: Mildly enlarged size, 40 x 38 mm at the mid ascending aorta
(level of the PA bifurcation) measured double oblique. No
calcifications. No dissection seen in visualized portions of the
aorta.

Aortic Valve: No calcifications. Trileaflet.

Other findings:

Normal pulmonary vein drainage into the left atrium.

Normal left atrial appendage without a thrombus.

Normal size of the pulmonary artery.

Normal appearance of the pericardium.

There is mild step artifact, but images are interpretable.
IMPRESSION: 1. No evidence of CAD, CADRADS = 0.

2. Coronary calcium score of 0. This was 0 percentile for age and
sex matched control.

3. Normal coronary origin with right dominance.

4.  Mildly enlarged ascending aorta.

INTERPRETATION:

1. CAD-RADS 0: No evidence of CAD (0%). Consider non-atherosclerotic
causes of chest pain.

2. CAD-RADS 1: Minimal non-obstructive CAD (0-24%). Consider
non-atherosclerotic causes of chest pain. Consider preventive
therapy and risk factor modification.

3. CAD-RADS 2: Mild non-obstructive CAD (25-49%). Consider
non-atherosclerotic causes of chest pain. Consider preventive
therapy and risk factor modification.

4. CAD-RADS 3: Moderate stenosis (50-69%). Consider symptom-guided
anti-ischemic pharmacotherapy as well as risk factor modification
per guideline directed care. Additional analysis with CT FFR will be
submitted.

5. CAD-RADS 4: Severe stenosis. (70-99% or > 50% left main). Cardiac
catheterization or CT FFR is recommended. Consider symptom-guided
anti-ischemic pharmacotherapy as well as risk factor modification
per guideline directed care. Invasive coronary angiography
recommended with revascularization per published guideline
statements.

6. CAD-RADS 5: Total coronary occlusion (100%). Consider cardiac
catheterization or viability assessment. Consider symptom-guided
anti-ischemic pharmacotherapy as well as risk factor modification
per guideline directed care.

7. CAD-RADS N: Non-diagnostic study. Obstructive CAD can't be
excluded. Alternative evaluation is recommended.

*** End of Addendum ***
EXAM:
OVER-READ INTERPRETATION  CT CHEST

The following report is an over-read performed by radiologist Dr.
Qwaqu Yorm [REDACTED] on 03/17/2021. This
over-read does not include interpretation of cardiac or coronary
anatomy or pathology. The coronary calcium score/coronary CTA
interpretation by the cardiologist is attached.
FINDINGS: Within the visualized portions of the thorax there are no suspicious
appearing pulmonary nodules or masses, there is no acute
consolidative airspace disease, no pleural effusions, no
pneumothorax and no lymphadenopathy. Visualized portions of the
upper abdomen are unremarkable. There are no aggressive appearing
lytic or blastic lesions noted in the visualized portions of the
skeleton.
IMPRESSION: 1. No significant incidental noncardiac findings are noted
